# Patient Record
Sex: Male | Born: 1945 | Race: White | Hispanic: No | Marital: Married | Smoking: Current every day smoker
Health system: Southern US, Community
[De-identification: ages and names within clinical notes are randomized; demographics above are authoritative.]

---

## 2016-07-13 ENCOUNTER — Ambulatory Visit (INDEPENDENT_AMBULATORY_CARE_PROVIDER_SITE_OTHER): Payer: Medicare Other | Admitting: Family Medicine

## 2016-07-13 ENCOUNTER — Ambulatory Visit (INDEPENDENT_AMBULATORY_CARE_PROVIDER_SITE_OTHER): Payer: Medicare Other

## 2016-07-13 ENCOUNTER — Other Ambulatory Visit: Payer: Self-pay | Admitting: Obstetrics and Gynecology

## 2016-07-13 VITALS — BP 124/72 | HR 51 | Temp 97.6°F | Resp 17 | Ht 71.0 in | Wt 196.0 lb

## 2016-07-13 DIAGNOSIS — M545 Low back pain, unspecified: Secondary | ICD-10-CM

## 2016-07-13 DIAGNOSIS — R34 Anuria and oliguria: Secondary | ICD-10-CM | POA: Diagnosis not present

## 2016-07-13 LAB — POCT URINALYSIS DIP (MANUAL ENTRY)
Bilirubin, UA: NEGATIVE
Glucose, UA: NEGATIVE
Ketones, POC UA: NEGATIVE
Leukocytes, UA: NEGATIVE
Nitrite, UA: NEGATIVE
PH UA: 5.5 (ref 5.0–8.0)
PROTEIN UA: NEGATIVE
SPEC GRAV UA: 1.015 (ref 1.030–1.035)
Urobilinogen, UA: 0.2 (ref ?–2.0)

## 2016-07-13 LAB — POC MICROSCOPIC URINALYSIS (UMFC): Mucus: ABSENT

## 2016-07-13 MED ORDER — SULFAMETHOXAZOLE-TRIMETHOPRIM 800-160 MG PO TABS: 1.0000 | ORAL_TABLET | Freq: Two times a day (BID) | ORAL | 0 refills | Status: AC

## 2016-07-13 MED ORDER — TRAMADOL HCL 50 MG PO TABS: 100.0000 mg | ORAL_TABLET | Freq: Four times a day (QID) | ORAL | 0 refills | Status: AC | PRN

## 2016-07-13 MED ORDER — SENNOSIDES-DOCUSATE SODIUM 8.6-50 MG PO TABS: 1.0000 | ORAL_TABLET | Freq: Two times a day (BID) | ORAL | 1 refills | Status: AC

## 2016-07-13 MED ORDER — TAMSULOSIN HCL 0.4 MG PO CAPS: 0.4000 mg | ORAL_CAPSULE | Freq: Every day | ORAL | 0 refills | Status: AC

## 2016-07-13 NOTE — Progress Notes (Signed)
Frank Garner is a 71 y.o. male new patient who presents to Primary Care at [facility] today for:   1.  Lower abdominal pressure. Patient presents with 1 week of lower abdominal pressure and back pain. Back pain is in the lower back bilateral; left greater than right. Patient states that when he stands associated with a lot of pressure and pain. Denies any groin pain. Patient states that this is happened before and he was told he had a swollen prostate was given antibiotics. Denies any fevers, blood in urine, BPH symptoms prior. States that he is not urinating like he usually does. States that he does not have full pressure when urinating and does not feel like he is emptying well. He has been drinking a lot more fluids and cranberry juice to help symptoms but this has not helped. Denies dysuria. Has been feeling well otherwise.   ROS as above.  Pertinently, no chest pain, palpitations, SOB, Fever, Chills, N/V/D.   PMH reviewed. .   No past medical history on file. No past surgical history on file.  Medications reviewed. No current outpatient prescriptions on file.   No current facility-administered medications for this visit.     Physical Exam:  BP 124/72 (BP Location: Right Arm, Patient Position: Sitting, Cuff Size: Large)   Pulse (!) 51   Temp 97.6 F (36.4 C) (Oral)   Resp 17   Ht 5\' 11"  (1.803 m)   Wt 196 lb (88.9 kg)   SpO2 97%   BMI 27.34 kg/m  Gen:  Alert, cooperative patient who appears stated age in mild acute distress.  Vital signs reviewed. HEENT: EOMI,  MMM Pulm:  Clear to auscultation bilaterally with good air movement.  No wheezes or rales noted.   Cardiac:  Regular rate and rhythm without murmur auscultated.  Good S1/S2. Abd:  Soft/nondistended/nontender. No guarding or rebound.  Good bowel sounds throughout all four quadrants.  Palpable bladder above pubic symphysis GU: Prostate exam normal. Non-tender, no nodules. Penis is unremarkable.    Results for orders  placed or performed in visit on 07/13/16  POCT Microscopic Urinalysis ([facility])  Result Value Ref Range   WBC,UR,HPF,POC None None WBC/hpf   RBC,UR,HPF,POC None None RBC/hpf   Bacteria None None, Too numerous to count   Mucus Absent Absent   Epithelial Cells, UR Per Microscopy Few (A) None, Too numerous to count cells/hpf  POCT urinalysis dipstick  Result Value Ref Range   Color, UA yellow yellow   Clarity, UA clear clear   Glucose, UA negative negative   Bilirubin, UA negative negative   Ketones, POC UA negative negative   Spec Grav, UA 1.015 1.030 - 1.035   Blood, UA trace-intact (A) negative   pH, UA 5.5 5.0 - 8.0   Protein Ur, POC negative negative   Urobilinogen, UA 0.2 Negative - 2.0   Nitrite, UA Negative Negative   Leukocytes, UA Negative Negative    Dg Abd 1 View  Result Date: 07/13/2016 CLINICAL DATA:  Left-sided pain. EXAM: ABDOMEN - 1 VIEW COMPARISON:  No recent . FINDINGS: Soft tissue structures are unremarkable. Stool noted throughout colon. Constipation cannot be excluded. Nonspecific air-filled loop of small bowel is noted. No free air. No acute bony abnormality. Pelvic calcifications system phleboliths. IMPRESSION: 1. Stool noted throughout the colon. Constipation cannot be excluded. 2. Nonspecific single air-filled loop of small bowel noted. Follow-up exam can be obtained to demonstrate resolution. Electronically Signed   By: Maisie Fushomas  Register   On: 07/13/2016  09:26    Assessment and Plan:  1. Decreased urine output Decreased urine output with decreased stream and abdominal pressure was concerning for prostatitis versus UTI versus BPH. Prostate exam is unremarkable. Was not tender to palpation ruling out prostatitis. Protstate also did not feel enlarged. UA no signs of UTI. DId show some trace blood but could be from cath. Rx given to patient for bactrim DS for 3 days, trmadol for pain, and flomax to help with urinary stream. Patient to follow-up wtthin 48hrs to be  reassessed .  2. Acute left-sided low back pain without sciatica Patient with lower back pain. KUB done to examine for any abdominal etiology versus renal stones. No signs of renal stones. However patient did have significant stool burden. Also had nonspecific air-filled loop this should be followed up when patient returns in 48 hours. No red flags on exam of back. Neurovascularly intact. Encouraged bowel regimen. - DG Abd 1 View; Future   Caryl Ada, DO 07/13/2016, 9:38 AM PGY-3, [facility]

## 2016-07-13 NOTE — Patient Instructions (Addendum)
No signs of UTI but sending urine for culture Pain medicine given, take alternately with ibuprofen Flomax given to help with stream Follow-up in 48hrs so we can reassess your symptoms   Renal Colic Renal colic is pain that is caused by a kidney stone. Follow these instructions at home:  Take medicines only as told by your doctor.  Ask your doctor if it is okay to take over-the-counter medicine for pain.  Drink enough fluid to keep your pee (urine) clear or pale yellow. Drink 6-8 glasses of water each day.  Eat less than 2 grams of salt per day.  Eat less protein. Some foods that have protein are meats, fish, nuts, and dairy.  Try not to eat spinach, rhubarb, nuts, or bran. Contact a doctor if:  You have a fever or chills.  Your pee smells bad or looks cloudy.  You have pain or burning when you pee. Get help right away if:  The pain in your side (flank) or your groin suddenly gets worse.  You get confused.  You pass out. This information is not intended to replace advice given to you by your health care provider. Make sure you discuss any questions you have with your health care provider. Document Released: 09/28/2007 Document Revised: 09/17/2015 Document Reviewed: 02/19/2014 Elsevier Interactive Patient Education  2017 [employer]     IF you received an x-ray today, you will receive an invoice from [facility]. Please contact [facility] at [phone] with questions or concerns regarding your invoice.   IF you received labwork today, you will receive an invoice from [facility]. Please contact [facility] at [phone] with questions or concerns regarding your invoice.   Our billing staff will not be able to assist you with questions regarding bills from these companies.  You will be contacted with the lab results as soon as they are available. The fastest way to get your results is to activate your My Chart account. Instructions are located on the  last page of this paperwork. If you have not heard from us regarding the results in 2 weeks, please contact this office.

## 2016-07-13 NOTE — Progress Notes (Deleted)
Pressure around kidney, lower back Cant stand Pressure around bladder No groin pain Standing feeels like pressing against spine Mostly on left side Has had before Drunk cranberry juice About a week Before had swollen prostate Was given antibiotics Has been moving lifting furntinure No fevers Not urinating like he should, not full pressure, not emptying well Drinking a lot more liquid to try and go more

## 2016-07-15 ENCOUNTER — Ambulatory Visit: Payer: Medicare Other

## 2016-07-15 LAB — URINE CULTURE: Organism ID, Bacteria: NO GROWTH

## 2016-07-16 ENCOUNTER — Encounter: Payer: Self-pay | Admitting: Family Medicine

## 2016-07-16 ENCOUNTER — Ambulatory Visit: Payer: Medicare Other

## 2016-07-16 ENCOUNTER — Ambulatory Visit (INDEPENDENT_AMBULATORY_CARE_PROVIDER_SITE_OTHER): Payer: Medicare Other | Admitting: Family Medicine

## 2016-07-16 VITALS — BP 118/60 | HR 65 | Temp 97.5°F | Resp 16 | Ht 71.0 in | Wt 195.2 lb

## 2016-07-16 DIAGNOSIS — M545 Low back pain: Secondary | ICD-10-CM

## 2016-07-16 DIAGNOSIS — R3911 Hesitancy of micturition: Secondary | ICD-10-CM | POA: Diagnosis not present

## 2016-07-16 DIAGNOSIS — R972 Elevated prostate specific antigen [PSA]: Secondary | ICD-10-CM

## 2016-07-16 LAB — POCT URINALYSIS DIP (MANUAL ENTRY)
BILIRUBIN UA: NEGATIVE
Bilirubin, UA: NEGATIVE
Glucose, UA: NEGATIVE
Leukocytes, UA: NEGATIVE
Nitrite, UA: NEGATIVE
PH UA: 5.5 (ref 5.0–8.0)
PROTEIN UA: NEGATIVE
SPEC GRAV UA: 1.015 (ref 1.030–1.035)
Urobilinogen, UA: 0.2 (ref ?–2.0)

## 2016-07-16 MED ORDER — BACLOFEN 10 MG PO TABS: 10.0000 mg | ORAL_TABLET | Freq: Three times a day (TID) | ORAL | 0 refills | Status: AC

## 2016-07-16 MED ORDER — IBUPROFEN 800 MG PO TABS: 800.0000 mg | ORAL_TABLET | Freq: Three times a day (TID) | ORAL | 1 refills | Status: AC | PRN

## 2016-07-16 NOTE — Progress Notes (Signed)
Frank BurdockRichard Ruthann Garner is a 71 y.o. male who presents to Primary Care at [facility] today for back pain:  1.  Back pain:  Persist. Patient seen 2 days ago for most of the abdominal pain. He was having some back pain worse on the left at that point. This has persisted. Describes dull aching pain left lumbar region. No radiation of buttocks. Duration leg. No bladder or bowel incontinence. No weakness. He has been taking tramadol with some relief of his pain. His pain returns after the tramadol wears off, also makes him nauseous.  2.  Decreased urinary output:  Seen here earlier this week for the same.  Doing much better from this standpoint.  Was also have trouble with urinary hesitancy. He is prescribed Flomax for this and concern for possible nephrolithiasis. This is definitely improved his hesitancy. He seen no blood. No dysuria. No nocturia.  ROS as above.    PMH reviewed. Patient is a nonsmoker.   No past medical history on file. No past surgical history on file.  Medications reviewed. Current Outpatient Prescriptions  Medication Sig Dispense Refill  . senna-docusate (SENOKOT-S) 8.6-50 MG tablet Take 1 tablet by mouth 2 (two) times daily. 30 tablet 1  . sulfamethoxazole-trimethoprim (BACTRIM DS,SEPTRA DS) 800-160 MG tablet Take 1 tablet by mouth 2 (two) times daily. 6 tablet 0  . tamsulosin (FLOMAX) 0.4 MG CAPS capsule Take 1 capsule (0.4 mg total) by mouth daily. 30 capsule 0  . traMADol (ULTRAM) 50 MG tablet Take 2 tablets (100 mg total) by mouth every 6 (six) hours as needed. 20 tablet 0   No current facility-administered medications for this visit.      Physical Exam:  BP 118/60   Pulse 65   Temp 97.5 F (36.4 C) (Oral)   Resp 16   Ht 5\' 11"  (1.803 m)   Wt 195 lb 3.2 oz (88.5 kg)   SpO2 96%   BMI 27.22 kg/m  Gen:  Alert, cooperative patient who appears stated age in no acute distress.  Vital signs reviewed. HEENT: EOMI,  MMM Pulm:  Clear to auscultation bilaterally with good  air movement.   Cardiac:  Regular rate and rhythm without murmur auscultated.   Abd:  Soft/nondistended/nontender.  Good bowel sounds throughout all four quadrants.  No masses noted.  Back:  Normal skin, Spine with normal alignment and no deformity.  No tenderness to vertebral process palpation.  Paraspinous muscles are tender left lumbar region with palpable spasm.   Range of motion is full at neck and decreased forward flexion lumbar sacral regions.  Straight leg raise is positive BL Neuro:  Sensation and motor function 5/5 bilateral lower extremities.  Achilles DTR's +2 BL, Unable to elicit patellar reflex BL.    Assessment and Plan:  1.  Lumbago: - Most likely lumbar strain. With some urinary symptoms were concerning possible nephrolithiasis. He is already on Flomax and tramadol for pain relief. Based on exam since he more likely musculoskeletal back pain. -Repeat UA did have some trace blood but he has had no colicky pain. No gross hematuria. -He is to follow-up in 5 days or so if he is not having any improvement in his pain. Sooner if any worsening. - Heat and massage as he has been. -Tramadol has made him nauseous. Plan to treat with any milligrams ibuprofen plus baclofen as muscle relaxer.  2.  Decreased urinary output: - better  - continue flomax - possibility of nephrolithiasis as well -- see above; being treated as  such. - FU in 2 weeks for repeat U/A to make sure this has improved.   3. Question of distention of small bowel loops on x-ray: -Seen twice in the past 3 days. -He is eating and drinking well. He is passing flatus and has had bowel movements. -He does become nauseous but only if he takes the tramadol. He is not taking the tramadol for 24 hours has had no more nausea. -This is not the same area where he is having pain. -Abdominal exam is benign today.  - When and if he begins having abdominal pain to come back and see [place] otherwise for now will hold off on further  workup for this

## 2016-07-16 NOTE — Patient Instructions (Signed)
     IF you received an x-ray today, you will receive an invoice from [facility]. Please contact [facility] at [phone] with questions or concerns regarding your invoice.   IF you received labwork today, you will receive an invoice from [facility]. Please contact [facility] at [phone] with questions or concerns regarding your invoice.   Our billing staff will not be able to assist you with questions regarding bills from these companies.  You will be contacted with the lab results as soon as they are available. The fastest way to get your results is to activate your My Chart account. Instructions are located on the last page of this paperwork. If you have not heard from us regarding the results in 2 weeks, please contact this office.     

## 2016-07-18 LAB — COMPREHENSIVE METABOLIC PANEL
A/G RATIO: 1.7 (ref 1.2–2.2)
ALT: 9 IU/L (ref 0–44)
AST: 16 IU/L (ref 0–40)
Albumin: 4.1 g/dL (ref 3.6–4.8)
Alkaline Phosphatase: 88 IU/L (ref 39–117)
BUN/Creatinine Ratio: 6 — ABNORMAL LOW (ref 10–24)
BUN: 6 mg/dL — AB (ref 8–27)
Bilirubin Total: 0.4 mg/dL (ref 0.0–1.2)
CALCIUM: 9.1 mg/dL (ref 8.6–10.2)
CO2: 24 mmol/L (ref 18–29)
Chloride: 102 mmol/L (ref 96–106)
Creatinine, Ser: 1.01 mg/dL (ref 0.76–1.27)
GFR, EST AFRICAN AMERICAN: 88 mL/min/{1.73_m2} (ref >59)
GFR, EST NON AFRICAN AMERICAN: 76 mL/min/{1.73_m2} (ref >59)
Globulin, Total: 2.4 g/dL (ref 1.5–4.5)
Glucose: 91 mg/dL (ref 65–99)
Potassium: 4.2 mmol/L (ref 3.5–5.2)
Sodium: 142 mmol/L (ref 134–144)
TOTAL PROTEIN: 6.5 g/dL (ref 6.0–8.5)

## 2016-07-18 LAB — CBC
HEMOGLOBIN: 14.1 g/dL (ref 13.0–17.7)
Hematocrit: 41.1 % (ref 37.5–51.0)
MCH: 32.2 pg (ref 26.6–33.0)
MCHC: 34.3 g/dL (ref 31.5–35.7)
MCV: 94 fL (ref 79–97)
PLATELETS: 174 10*3/uL (ref 150–379)
RBC: 4.38 x10E6/uL (ref 4.14–5.80)
RDW: 14.3 % (ref 12.3–15.4)
WBC: 7.8 10*3/uL (ref 3.4–10.8)

## 2016-07-18 LAB — PSA: PROSTATE SPECIFIC AG, SERUM: 6.3 ng/mL — AB (ref 0.0–4.0)

## 2016-07-18 NOTE — Addendum Note (Signed)
Addended byGwendolyn Grant: Shizuko Wojdyla, Newt LukesJEFFREY H on: 07/18/2016 08:50 AM   Modules accepted: Orders

## 2017-12-12 IMAGING — DX DG ABDOMEN 1V
2 series · 2 of 2 positions shown · non-contrast
Comparison: 07/13/2016

CLINICAL DATA: 70-year-old male with a history of low back and
flank pain

EXAM:
ABDOMEN - 1 VIEW

[abdomen kub (1 of 2)]
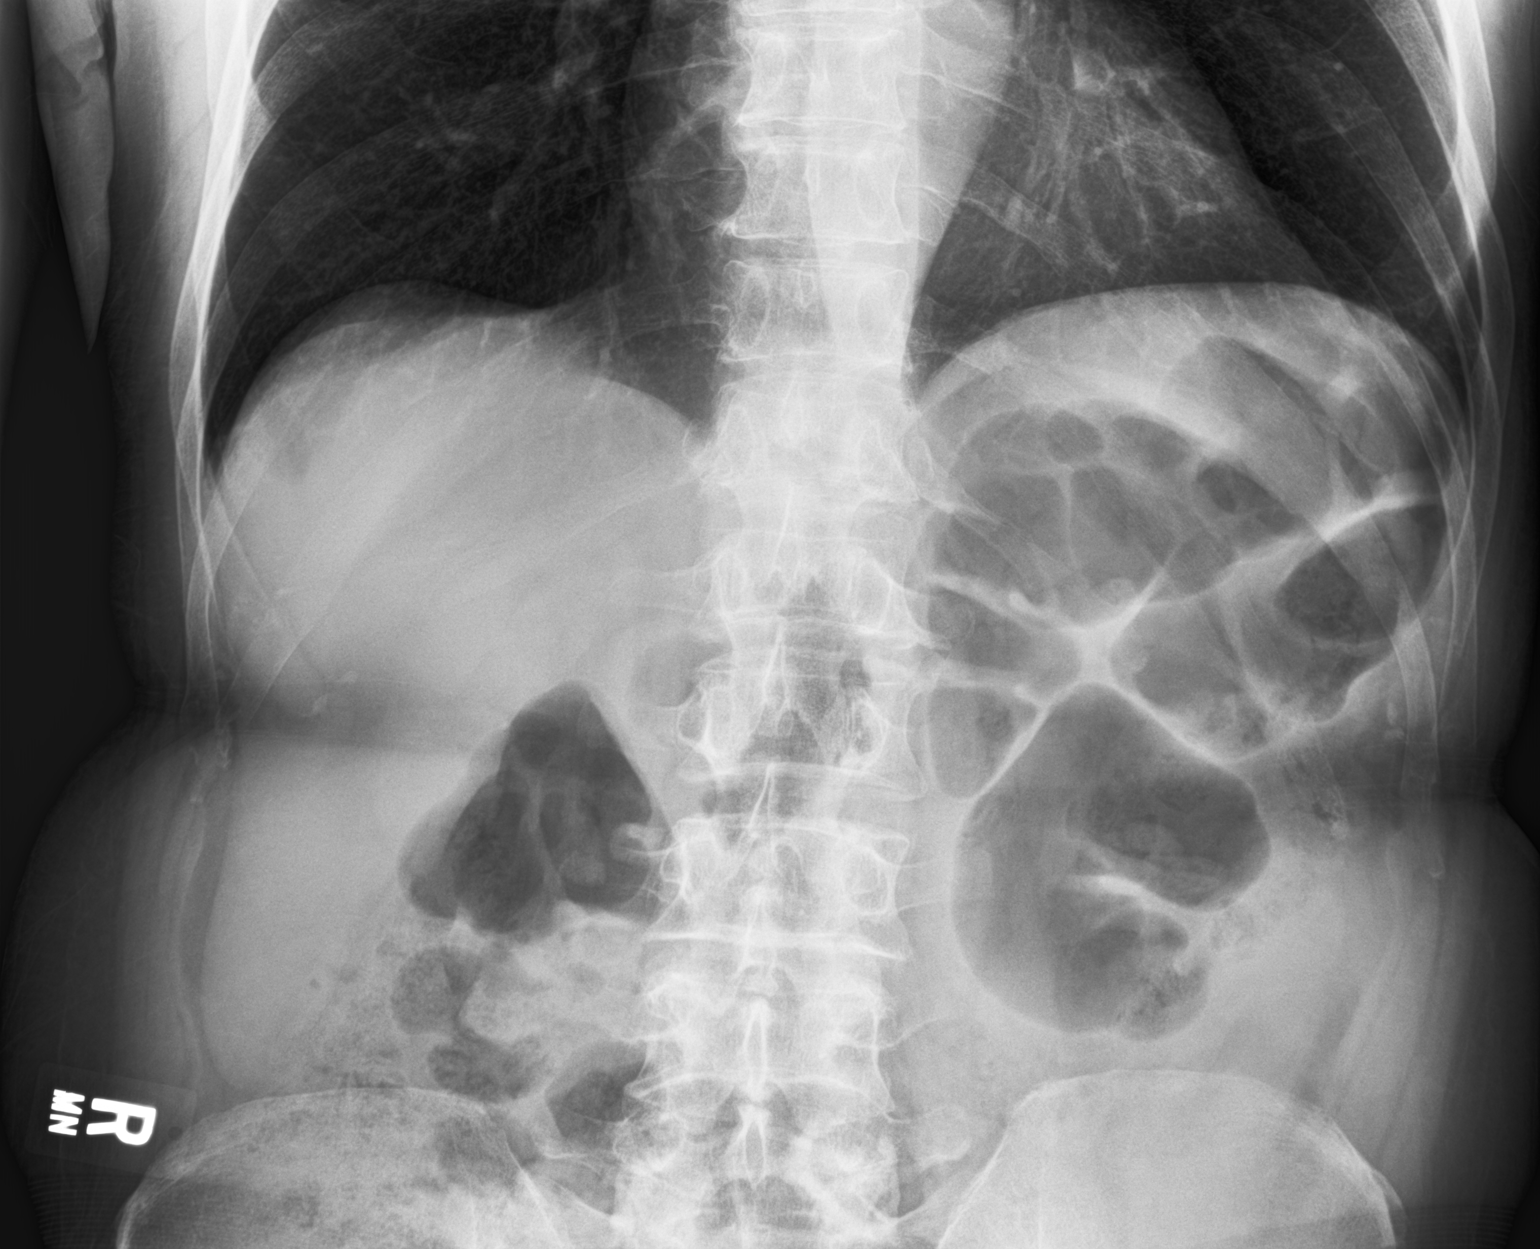

[abdomen kub (2 of 2)]
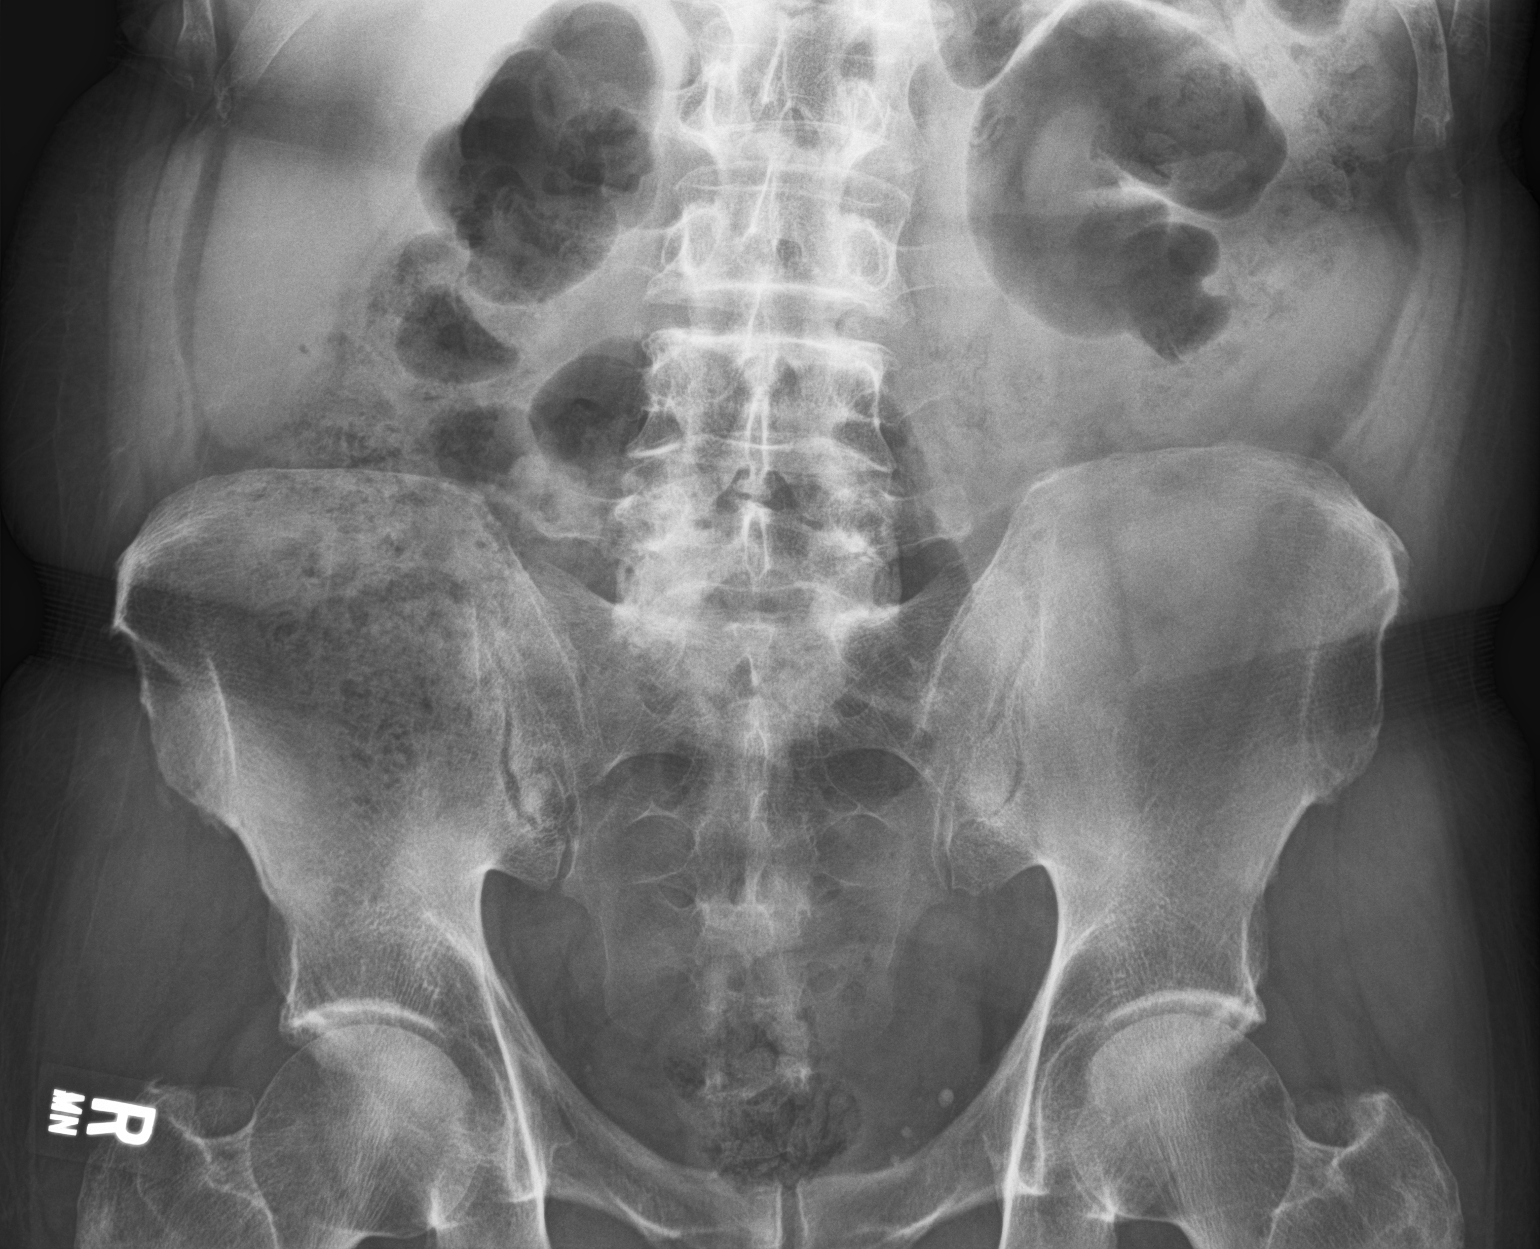

[2 of 2 positions shown; findings below may reference images not displayed]

FINDINGS: Gas within stomach, small bowel, colon. No abnormally distended
colonic loops. Borderline distention of small bowel loops in the
left upper quadrant.

Moderate formed stool burden within the left and right colon.

Pelvic phleboliths.

No unexpected radiopaque foreign body.

No unexpected calcifications.
IMPRESSION: Similar nonspecific gas pattern to the prior x-ray, without evidence
of obstruction. If there is concern for acute intra-abdominal
process, CT may be considered.

## 2022-09-06 ENCOUNTER — Inpatient Hospital Stay (HOSPITAL_COMMUNITY)
Admission: RE | Admit: 2022-09-06 | Discharge: 2022-09-06 | Disposition: A | Discharge status: Home / Self Care | Payer: 59 | Source: Ambulatory Visit

## 2022-09-06 ENCOUNTER — Other Ambulatory Visit: Payer: Self-pay

## 2022-09-06 ENCOUNTER — Emergency Department (HOSPITAL_COMMUNITY): Payer: 59

## 2022-09-06 ENCOUNTER — Emergency Department (HOSPITAL_COMMUNITY)
Admission: EM | Admit: 2022-09-06 | Discharge: 2022-09-06 | Disposition: A | Discharge status: Home / Self Care | Payer: 59 | Attending: Emergency Medicine | Admitting: Emergency Medicine

## 2022-09-06 ENCOUNTER — Encounter (HOSPITAL_COMMUNITY): Payer: Self-pay

## 2022-09-06 DIAGNOSIS — W010XXA Fall on same level from slipping, tripping and stumbling without subsequent striking against object, initial encounter: Secondary | ICD-10-CM | POA: Diagnosis not present

## 2022-09-06 DIAGNOSIS — M25531 Pain in right wrist: Secondary | ICD-10-CM

## 2022-09-06 DIAGNOSIS — W19XXXA Unspecified fall, initial encounter: Secondary | ICD-10-CM

## 2022-09-06 NOTE — ED Triage Notes (Signed)
Pt arrives ambulatory via POV with a complaint of a trip and fall, Pt tried to stop himself and injured his right hand.Fall happened a couple of days ago.PT did not hit head or lose LOC.

## 2022-09-06 NOTE — Discharge Instructions (Addendum)
It was a pleasure taking care of you today.  Your x-ray did not show any broken bones.  It did show some arthritis.  Keep splint on as needed for comfort.  You may take over-the-counter ibuprofen or Tylenol as needed for pain.  Continue to ice and elevate your arm.  Follow-up with PCP if symptoms do not improve over the next few days.  Return to the ER for new or worsening symptoms.  I have included the number of the hand surgeon. Call to schedule an appointment if symptoms do not improve over the next few days.

## 2022-09-06 NOTE — ED Provider Notes (Signed)
[place] EMERGENCY DEPARTMENT AT [facility] Provider Note   CSN: [id number] Arrival date & time: 09/06/22  1221     History  Chief Complaint  Patient presents with   Wrist Pain    Frank Garner is a 77 y.o. male with no significant past medical history who presents to the ED due to right wrist pain after a mechanical fall.  Patient states he tripped and fell a few days ago. He was able to catch his fall with his right arm against a wall. No head injury or LOC. Not on any blood thinners. Patient is right hand dominant. Pain worse with movement. Denies numbness/tingling and weakness. No other injuries.   History obtained from patient and past medical records. No interpreter used during encounter.       Home Medications Prior to Admission medications   Medication Sig Start Date End Date Taking? Authorizing Provider  baclofen (LIORESAL) 10 MG tablet Take 1 tablet (10 mg total) by mouth 3 (three) times daily. For muscle spasms 07/16/16   Tobey Grim, MD  ibuprofen (ADVIL,MOTRIN) 800 MG tablet Take 1 tablet (800 mg total) by mouth every 8 (eight) hours as needed. 07/16/16   Tobey Grim, MD  senna-docusate (SENOKOT-S) 8.6-50 MG tablet Take 1 tablet by mouth 2 (two) times daily. 07/13/16   Pincus Large, DO  tamsulosin (FLOMAX) 0.4 MG CAPS capsule Take 1 capsule (0.4 mg total) by mouth daily. 07/13/16   Pincus Large, DO  traMADol (ULTRAM) 50 MG tablet Take 2 tablets (100 mg total) by mouth every 6 (six) hours as needed. 07/13/16   Pincus Large, DO      Allergies    Patient has no known allergies.    Review of Systems   Review of Systems  Musculoskeletal:  Positive for arthralgias.    Physical Exam Updated Vital Signs BP 128/77   Pulse 78   Temp (!) 97.5 F (36.4 C) (Oral)   Resp 16   Ht 5\' 11"  (1.803 m)   Wt 88.5 kg   SpO2 99%   BMI 27.21 kg/m  Physical Exam Vitals and nursing note reviewed.  Constitutional:      General: He is not in  acute distress.    Appearance: He is not ill-appearing.  HENT:     Head: Normocephalic.  Eyes:     Pupils: Pupils are equal, round, and reactive to light.  Cardiovascular:     Rate and Rhythm: Normal rate and regular rhythm.     Pulses: Normal pulses.     Heart sounds: Normal heart sounds. No murmur heard.    No friction rub. No gallop.  Pulmonary:     Effort: Pulmonary effort is normal.     Breath sounds: Normal breath sounds.  Abdominal:     General: Abdomen is flat. There is no distension.     Palpations: Abdomen is soft.     Tenderness: There is no abdominal tenderness. There is no guarding or rebound.  Musculoskeletal:        General: Normal range of motion.     Cervical back: Neck supple.     Comments: Mild tenderness on ulnar aspect of right wrist. Full ROM of right wrist, fingers, and elbow. Soft compartments. No snuffbox tenderness.   Skin:    General: Skin is warm and dry.  Neurological:     General: No focal deficit present.     Mental Status: He is alert.  Psychiatric:  Mood and Affect: Mood normal.        Behavior: Behavior normal.     ED Results / Procedures / Treatments   Labs (all labs ordered are listed, but only abnormal results are displayed) Labs Reviewed - No data to display  EKG None  Radiology DG Wrist Complete Right  Result Date: 09/06/2022 CLINICAL DATA:  Fall a couple of days ago. Tried to stop self, injuring right wrist. EXAM: RIGHT WRIST - COMPLETE 3+ VIEW COMPARISON:  None Available. FINDINGS: Normal bone mineralization. There is 1 mm ulnar negative variance. Mild thumb carpometacarpal joint space narrowing and peripheral spurring. Well corticated 2 mm chronic ossicle at the medial aspect of the thumb carpometacarpal joint. No acute fracture is seen. No dislocation. IMPRESSION: 1. No acute fracture. 2. Mild thumb carpometacarpal osteoarthritis. Electronically Signed   By: Neita Garnet M.D.   On: 09/06/2022 13:45     Procedures Procedures    Medications Ordered in ED Medications - No data to display  ED Course/ Medical Decision Making/ A&P                             Medical Decision Making Amount and/or Complexity of Data Reviewed Independent Historian: spouse Radiology: ordered and independent interpretation performed. Decision-making details documented in ED Course.   77 year old male presents to the ED due to right arm injury after a mechanical fall a few days ago.  Patient broke his fall with his right arm against a wall.  No head injury or loss of consciousness.  Patient is right-hand dominant.  No other injuries.  Upon arrival, stable vitals.  Patient in no acute distress.  Mild tenderness to ulnar aspect of right wrist.  No erythema, edema, or warmth.  Full range of motion.  Low suspicion for septic joint.  Radial pulse intact.  Soft compartments.  Low suspicion for compartment syndrome.  X-ray personally reviewed and interpreted which is negative for any bony fractures.  Patient placed in splint for comfort.  RICE discussed with patient.  Patient declined pain medication here in the ED.  Advised patient to follow-up with PCP if symptoms do not improve over the next few days. Strict ED precautions discussed with patient. Patient states understanding and agrees to plan. Patient discharged home in no acute distress and stable vitals  No PCP Lives with wife       Final Clinical Impression(s) / ED Diagnoses Final diagnoses:  Right wrist pain  Fall, initial encounter    Rx / DC Orders ED Discharge Orders     None         Jesusita Oka 09/06/22 1550    Lorre Nick, MD 09/07/22 [phone]
